# Patient Record
Sex: Female | Born: 1995 | Race: White | Hispanic: No | Marital: Single | Smoking: Never smoker
Health system: Southern US, Community
[De-identification: ages and names within clinical notes are randomized; demographics above are authoritative.]

## PROBLEM LIST (undated history)

## (undated) DIAGNOSIS — K219 Gastro-esophageal reflux disease without esophagitis: Secondary | ICD-10-CM

## (undated) HISTORY — DX: Gastro-esophageal reflux disease without esophagitis: K21.9

## (undated) HISTORY — PX: TONSILLECTOMY AND ADENOIDECTOMY: SHX28

---
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
## 2015-07-19 IMAGING — CR DG FOOT COMPLETE 3+V*L*
3 series · 3 of 3 positions shown · non-contrast
Comparison: October 01, 2014

CLINICAL DATA: Fracture proximal fifth metatarsal

EXAM:
LEFT FOOT - COMPLETE 3+ VIEW

[AP]
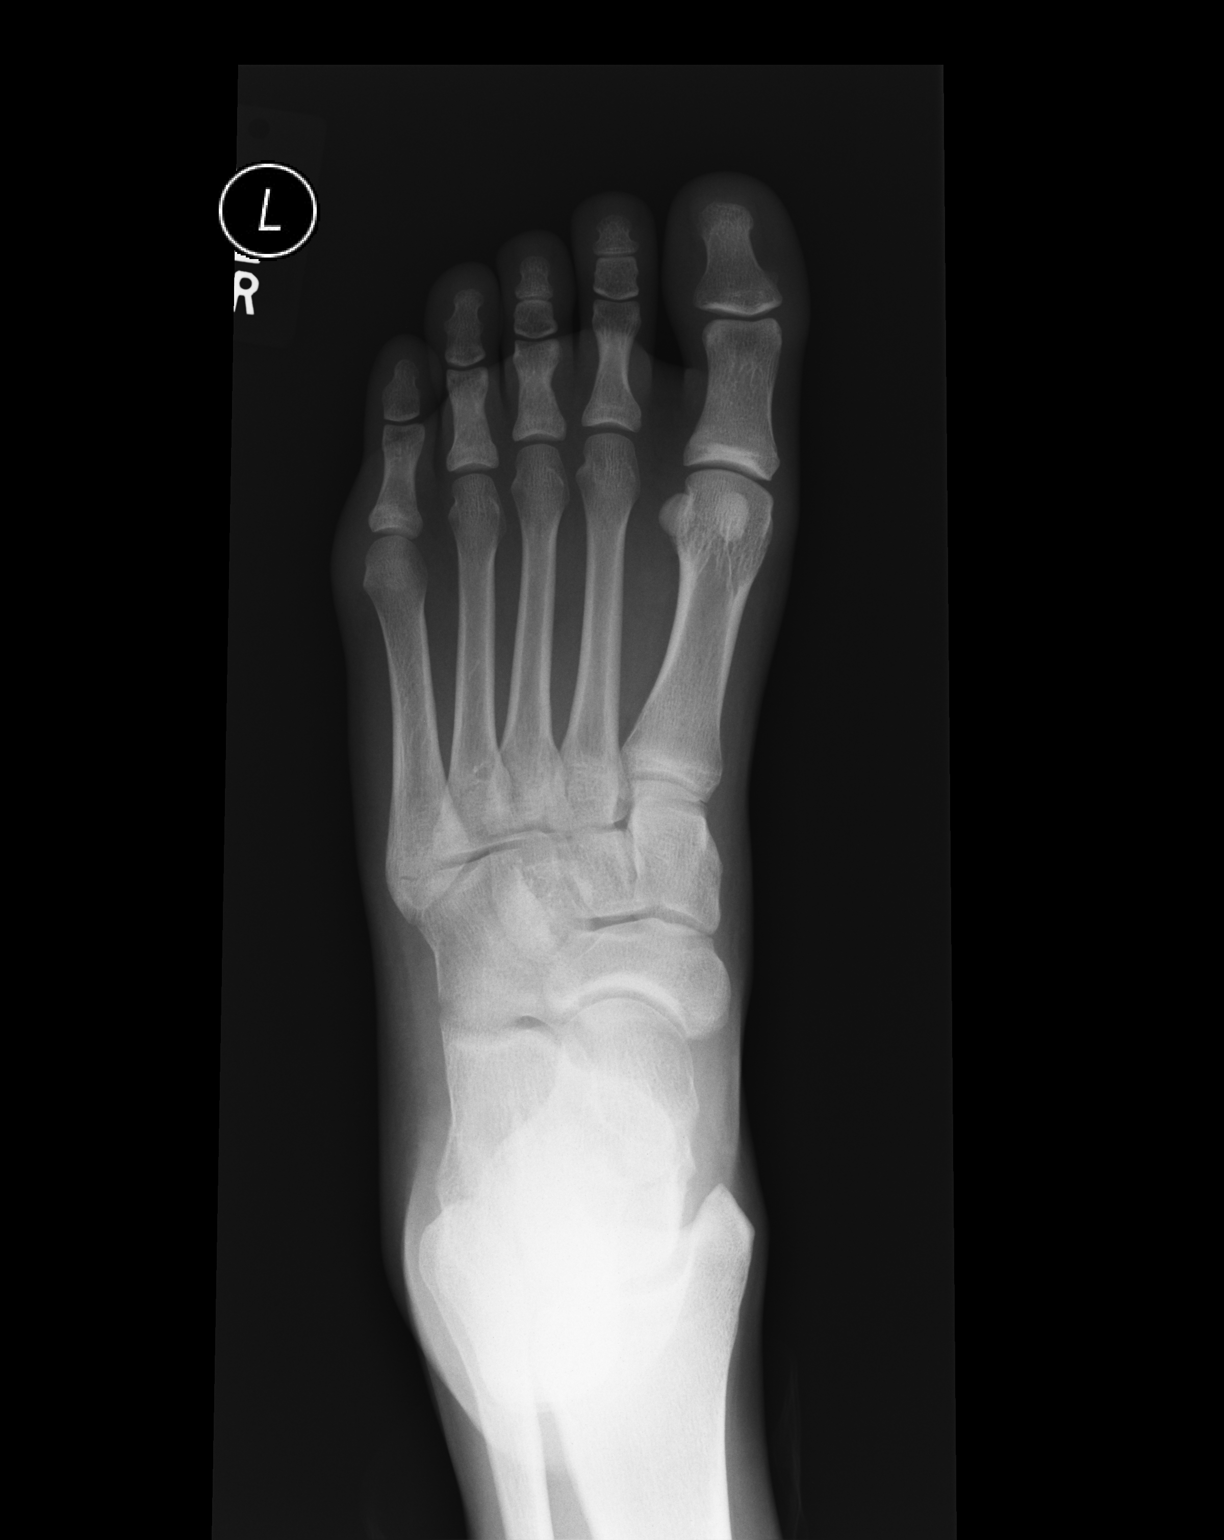

[ap obl int rot]
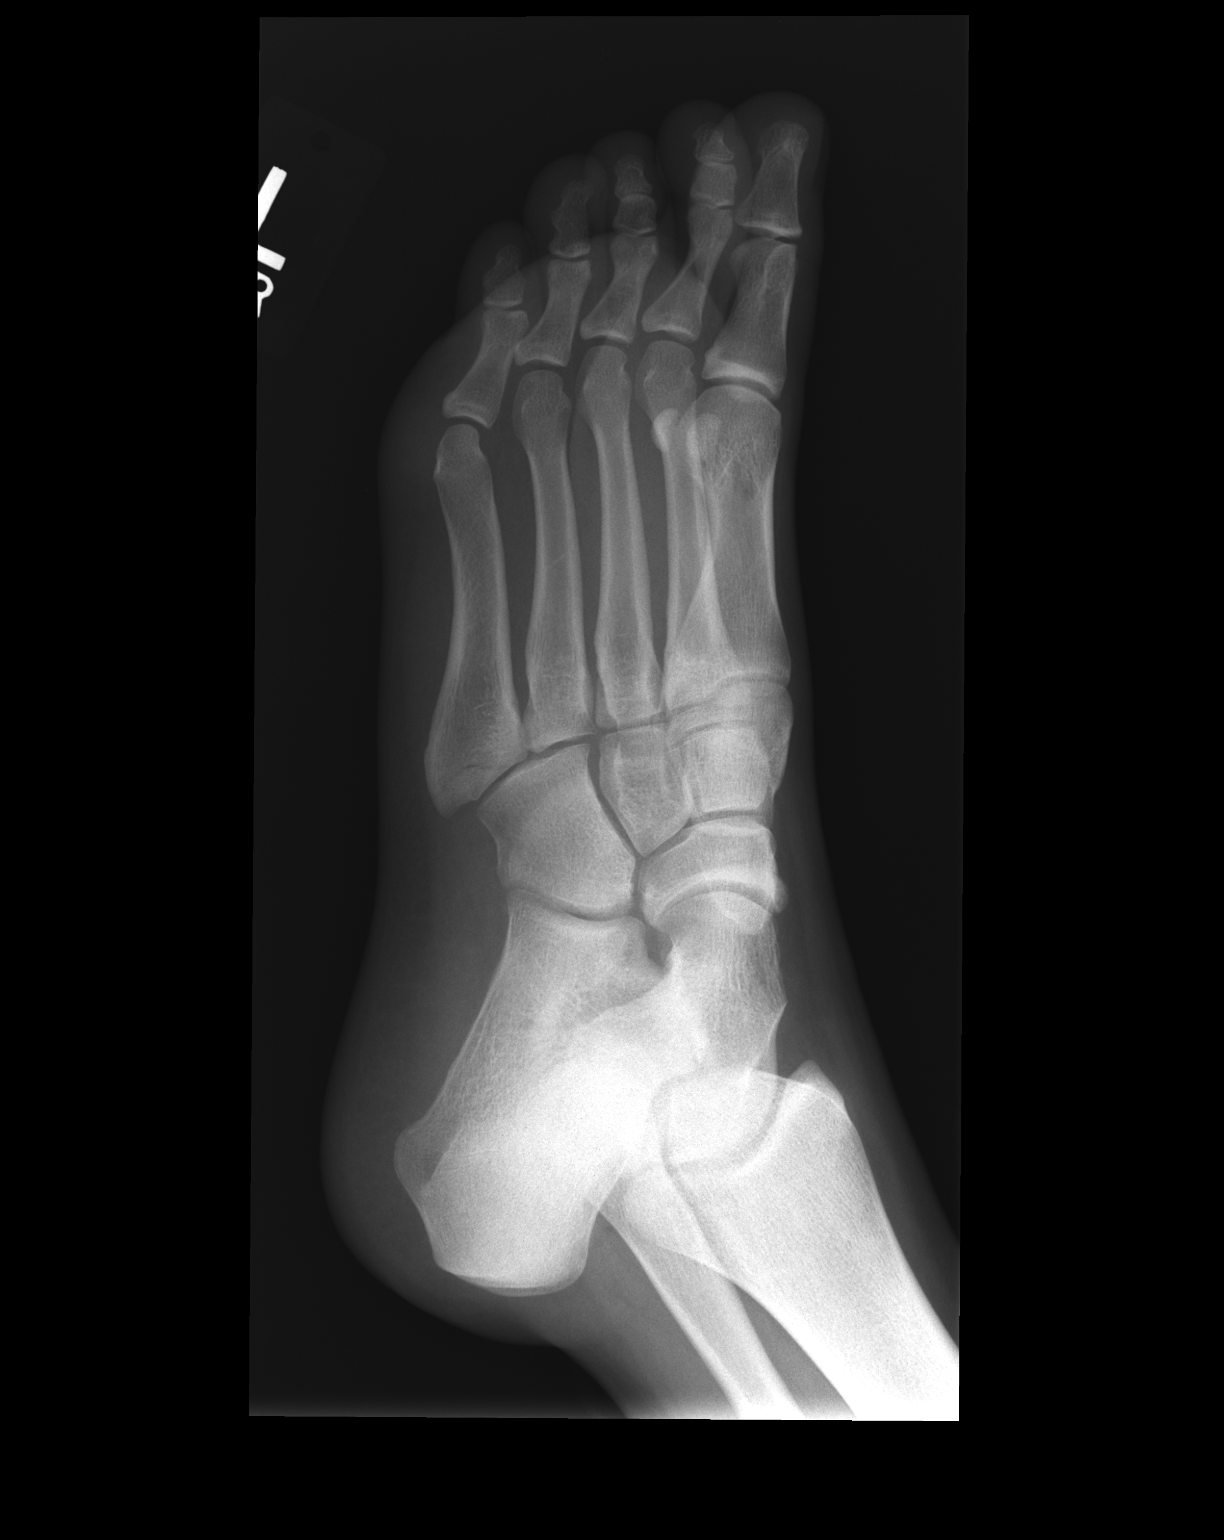

[lateral]
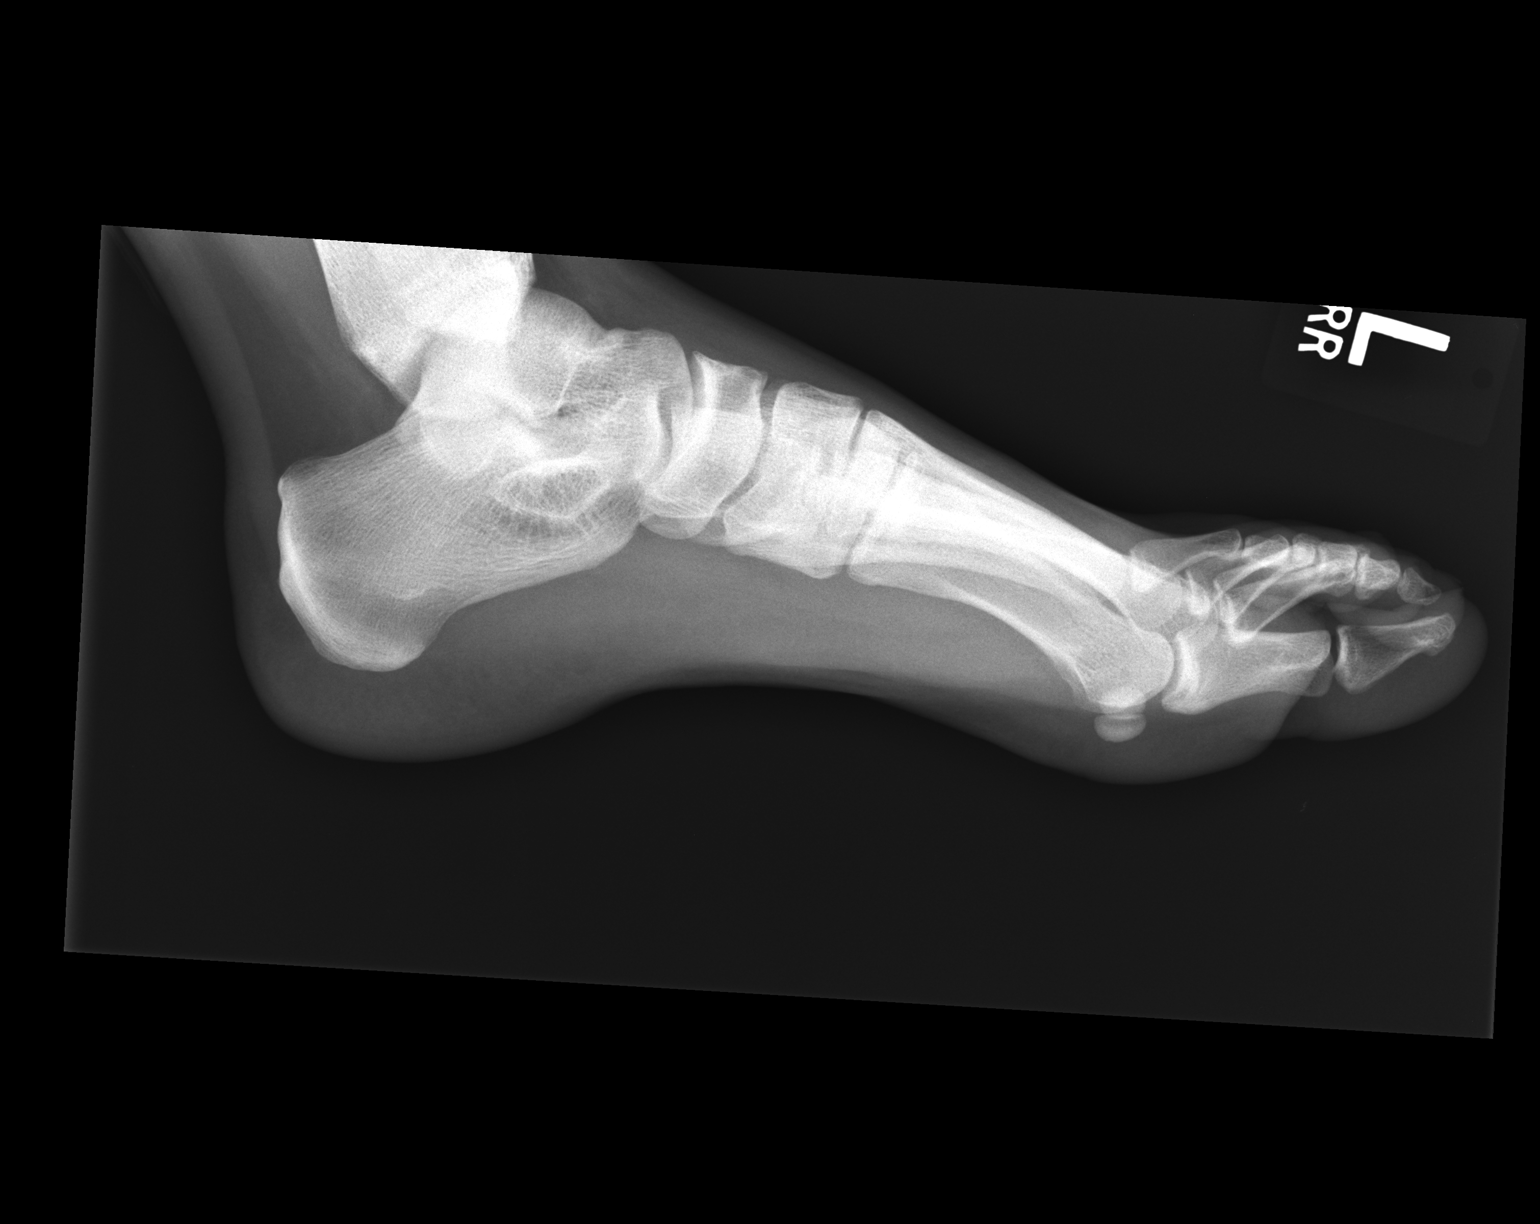

[3 of 3 positions shown; findings below may reference images not displayed]

FINDINGS: Frontal, oblique, and lateral views were obtained. Fracture along
the proximal fifth metatarsal is again noted without appreciable
change. Alignment is anatomic in this area. No other fracture. No
dislocation. Joint spaces appear intact. No erosive change.
IMPRESSION: Unchanged fracture proximal fifth metatarsal in anatomic alignment.
No new fracture. No dislocation. No appreciable arthropathic change.
